# Patient Record
Sex: Male | Born: 1995 | Race: White | Hispanic: No | Marital: Single | Smoking: Never smoker
Health system: Southern US, Community
[De-identification: ages and names within clinical notes are randomized; demographics above are authoritative.]

---
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
## 2020-03-08 ENCOUNTER — Ambulatory Visit
Admission: EM | Admit: 2020-03-08 | Discharge: 2020-03-08 | Disposition: A | Discharge status: Home / Self Care | Attending: Emergency Medicine | Admitting: Emergency Medicine

## 2020-03-08 ENCOUNTER — Encounter: Payer: Self-pay | Admitting: Emergency Medicine

## 2020-03-08 ENCOUNTER — Other Ambulatory Visit: Payer: Self-pay

## 2020-03-08 DIAGNOSIS — S0502XA Injury of conjunctiva and corneal abrasion without foreign body, left eye, initial encounter: Secondary | ICD-10-CM

## 2020-03-08 DIAGNOSIS — H579 Unspecified disorder of eye and adnexa: Secondary | ICD-10-CM

## 2020-03-08 MED ORDER — HYPROMELLOSE 0.3 % OP GEL: OPHTHALMIC | 0 refills | Status: AC | PRN

## 2020-03-08 MED ORDER — POLYMYXIN B-TRIMETHOPRIM 10000-0.1 UNIT/ML-% OP SOLN: 1.0000 [drp] | Freq: Four times a day (QID) | OPHTHALMIC | 0 refills | Status: AC

## 2020-03-08 NOTE — Discharge Instructions (Signed)
Eye flushed in office Use polytrim eye drops as prescribed and to completion Genteal gel eye drops at night as needed for symptomatic relief Use OTC ibuprofen or tylenol as needed for pain relief Return here or go to the ED if symptoms persists or worsen such as fever, chills, redness, swelling, eye pain, painful eye movements, vision changes, etc..Marland Kitchen

## 2020-03-08 NOTE — ED Provider Notes (Signed)
[facility]   [id number] 03/08/20 Arrival Time: 1833  CC: Eye pain; FB sensation  SUBJECTIVE:  Vernon Olson is a 24 y.o. male who presents with complaint of eye discomfort, FB sensation, and tearing x 1 day.  Symptoms began after getting carbon dust in eye.  Was working on a car prior to symptoms.  Has tried flushing eye at work without relief.  Symptoms are made worse with light.  Denies similar symptoms in the past.  Denies fever, chills, nausea, vomiting, painful eye movements, discharge, itching, vision changes, double vision, periorbital erythema.     Denies contact lens use.    ROS: As per HPI.  All other pertinent ROS negative.     History reviewed. No pertinent past medical history. History reviewed. No pertinent surgical history. Allergies  Allergen Reactions  . Other Hives and Itching    All nuts per mother   No current facility-administered medications on file prior to encounter.   Current Outpatient Medications on File Prior to Encounter  Medication Sig Dispense Refill  . cetirizine (ZYRTEC) 10 MG tablet Take 10 mg by mouth daily as needed. For allergies    . PRESCRIPTION MEDICATION Take 1 capsule by mouth daily. Prescription medication taken once daily 30 minutes before meals. Used for help with digestion     Social History   Socioeconomic History  . Marital status: Single    Spouse name: Not on file  . Number of children: Not on file  . Years of education: Not on file  . Highest education level: Not on file  Occupational History  . Not on file  Tobacco Use  . Smoking status: Never Smoker  . Smokeless tobacco: Never Used  Substance and Sexual Activity  . Alcohol use: No  . Drug use: No  . Sexual activity: Not on file  Other Topics Concern  . Not on file  Social History Narrative  . Not on file   Social Determinants of Health   Financial Resource Strain:   . Difficulty of Paying Living Expenses:   Food Insecurity:   . Worried About  Programme researcher, broadcasting/film/video in the Last Year:   . Barista in the Last Year:   Transportation Needs:   . Freight forwarder (Medical):   Marland Kitchen Lack of Transportation (Non-Medical):   Physical Activity:   . Days of Exercise per Week:   . Minutes of Exercise per Session:   Stress:   . Feeling of Stress :   Social Connections:   . Frequency of Communication with Friends and Family:   . Frequency of Social Gatherings with Friends and Family:   . Attends Religious Services:   . Active Member of Clubs or Organizations:   . Attends Banker Meetings:   Marland Kitchen Marital Status:   Intimate Partner Violence:   . Fear of Current or Ex-Partner:   . Emotionally Abused:   Marland Kitchen Physically Abused:   . Sexually Abused:    No family history on file.  OBJECTIVE:    Visual Acuity  Right Eye Distance: 20 Left Eye Distance: 20 Bilateral Distance: 20  Right Eye Near: R Near: 20 Left Eye Near:  L Near: 20 Bilateral Near:  20   Vitals:   03/08/20 1846  BP: 130/83  Pulse: 77  Resp: 18  Temp: 97.6 F (36.4 C)  TempSrc: Oral  SpO2: 98%    General appearance: alert; no distress Eyes: mild to moderate conjunctival erythema. PERRL; EOMI without  discomfort;  no obvious drainage, excessive tearing; no obvious fluorescein uptake  Neck: supple Lungs: clear to auscultation bilaterally Heart: regular rate and rhythm Skin: warm and dry Psychological: alert and cooperative; normal mood and affect   ASSESSMENT & PLAN:  1. Abrasion of left cornea, initial encounter   2. Sensation of foreign body in eye     Meds ordered this encounter  Medications  . trimethoprim-polymyxin b (POLYTRIM) ophthalmic solution    Sig: Place 1 drop into the left eye 4 (four) times daily for 10 days.    Dispense:  10 mL    Refill:  0    Order Specific Question:   Supervising Provider    Answer:   Eustace Moore [[id number]]  . hypromellose (GENTEAL) 0.3 % GEL ophthalmic ointment    Sig: Place into the left  eye every 4 (four) hours as needed for dry eyes.    Dispense:  10 g    Refill:  0    Order Specific Question:   Supervising Provider    Answer:   Eustace Moore [[id number]]   Eye flushed in office Use polytrim eye drops as prescribed and to completion Genteal gel eye drops at night as needed for symptomatic relief Use OTC ibuprofen or tylenol as needed for pain relief Return here or go to the ED if symptoms persists or worsen such as fever, chills, redness, swelling, eye pain, painful eye movements, vision changes, etc...   Reviewed expectations re: course of current medical issues. Questions answered. Outlined signs and symptoms indicating need for more acute intervention. Patient verbalized understanding. After Visit Summary given.   Rennis Harding, PA-C 03/08/20 1945

## 2020-03-08 NOTE — ED Triage Notes (Signed)
States in had got carbon in his eye today.

## 2021-04-18 ENCOUNTER — Emergency Department: Payer: Self-pay

## 2021-04-18 ENCOUNTER — Encounter: Payer: Self-pay | Admitting: *Deleted

## 2021-04-18 ENCOUNTER — Other Ambulatory Visit: Payer: Self-pay

## 2021-04-18 ENCOUNTER — Emergency Department
Admission: EM | Admit: 2021-04-18 | Discharge: 2021-04-18 | Disposition: A | Discharge status: Home / Self Care | Payer: Self-pay | Attending: Emergency Medicine | Admitting: Emergency Medicine

## 2021-04-18 DIAGNOSIS — W1830XA Fall on same level, unspecified, initial encounter: Secondary | ICD-10-CM | POA: Insufficient documentation

## 2021-04-18 DIAGNOSIS — S52101A Unspecified fracture of upper end of right radius, initial encounter for closed fracture: Secondary | ICD-10-CM | POA: Insufficient documentation

## 2021-04-18 DIAGNOSIS — M25521 Pain in right elbow: Secondary | ICD-10-CM | POA: Insufficient documentation

## 2021-04-18 MED ORDER — ACETAMINOPHEN 500 MG PO TABS
1000.0000 mg | ORAL_TABLET | Freq: Once | ORAL | Status: AC
  Administered 2021-04-18: 1000 mg via ORAL
  Filled 2021-04-18: qty 2

## 2021-04-18 NOTE — ED Provider Notes (Signed)
[facility] Emergency Department Provider Note ____________________________________________   Event Date/Time   First MD Initiated Contact with Patient 04/18/21 [phone]     (approximate)  I have reviewed the triage vital signs and the nursing notes.   HISTORY  Chief Complaint Fall    HPI MAY OZMENT is a 25 y.o. male with PMH as noted below who presents with a right elbow injury after a mechanical fall from standing height.  The patient states that he had the elbow directly.  He denies any other injuries.  He states that the elbow hurts bearing any weight.  He denies any weakness or numbness in the hand.  History reviewed. No pertinent past medical history.  There are no problems to display for this patient.   History reviewed. No pertinent surgical history.  Prior to Admission medications   Medication Sig Start Date End Date Taking? Authorizing Provider  cetirizine (ZYRTEC) 10 MG tablet Take 10 mg by mouth daily as needed. For allergies    [provider]  hypromellose (GENTEAL) 0.3 % GEL ophthalmic ointment Place into the left eye every 4 (four) hours as needed for dry eyes. 03/08/20   Wurst, [place], PA-C  PRESCRIPTION MEDICATION Take 1 capsule by mouth daily. Prescription medication taken once daily 30 minutes before meals. Used for help with digestion    [provider]    Allergies Other  No family history on file.  Social History Social History   Tobacco Use   Smoking status: Never   Smokeless tobacco: Never  Substance Use Topics   Alcohol use: No   Drug use: No    Review of Systems  Constitutional: No fever. Eyes: No visual changes. ENT: No sore throat. Cardiovascular: Denies chest pain. Respiratory: Denies shortness of breath. Gastrointestinal: No vomiting. Genitourinary: Negative for flank pain. Musculoskeletal: Negative for back pain.  Positive for right elbow injury. Skin: Negative for  rash. Neurological: Negative for headache.   ____________________________________________   PHYSICAL EXAM:  VITAL SIGNS: ED Triage Vitals  Enc Vitals Group     BP 04/18/21 0107 (!) 133/93     Pulse Rate 04/18/21 0107 83     Resp 04/18/21 0107 16     Temp 04/18/21 0107 98.4 F (36.9 C)     Temp Source 04/18/21 0107 Oral     SpO2 04/18/21 0107 96 %     Weight --      Height --      Head Circumference --      Peak Flow --      Pain Score 04/18/21 0105 4     Pain Loc --      Pain Edu? --      Excl. in GC? --     Constitutional: Alert and oriented. Well appearing and in no acute distress. Eyes: Conjunctivae are normal.  Head: Atraumatic. Nose: No congestion/rhinnorhea. Mouth/Throat: Mucous membranes are moist.   Neck: Normal range of motion.  Cardiovascular: Normal rate, regular rhythm. Good peripheral circulation. Respiratory: Normal respiratory effort.  No retractions.  Gastrointestinal: No distention.  Musculoskeletal: No lower extremity edema.  Extremities warm and well perfused.  2+ radial pulse to right upper extremity.  Right elbow swollen and tender.  Superficial abrasion of the elbow.  Pain on any range of motion.  No tenderness of the proximal humerus. Neurologic:  Normal speech and language.  Motor and sensory intact in median, radial, and ulnar distributions in the right arm. Skin:  Skin is warm and  dry. No rash noted. Psychiatric: Mood and affect are normal. Speech and behavior are normal.  ____________________________________________   LABS (all labs ordered are listed, but only abnormal results are displayed)  Labs Reviewed - No data to display ____________________________________________  EKG   ____________________________________________  RADIOLOGY  XR R elbow: Proximal radius fracture, possible distal humerus fracture  ____________________________________________   PROCEDURES  Procedure(s) performed: No  Procedures  Critical Care  performed: No ____________________________________________   INITIAL IMPRESSION / ASSESSMENT AND PLAN / ED COURSE  Pertinent labs & imaging results that were available during my care of the patient were reviewed by me and considered in my medical decision making (see chart for details).   25 year old male with no active medical problems presents with a right elbow injury after mechanical fall from standing height.  He denies any other injuries.  X-ray obtained from triage shows proximal radius fracture and possible distal humerus fracture.  I consulted Dr. Hyacinth Meeker from orthopedics and I am awaiting further recommendations.  ----------------------------------------- 6:48 AM on 04/18/2021 -----------------------------------------  Dr. Hyacinth Meeker has reviewed the x-rays and recommends placing the patient in a sugar-tong splint and having him follow-up with orthopedics in 2 to 3 days.  He does not recommend any additional imaging emergently.  The splint has been placed.  The patient is comfortable with just Tylenol, and he does not want to be prescribed any opiates.  He is stable for discharge home.  I counseled him on the results of the x-rays and the plan of care.  Return precautions given, and he expresses understanding.  ____________________________________________   FINAL CLINICAL IMPRESSION(S) / ED DIAGNOSES  Final diagnoses:  Closed fracture of proximal end of right radius, unspecified fracture morphology, initial encounter      NEW MEDICATIONS STARTED DURING THIS VISIT:  New Prescriptions   No medications on file     Note:  This document was prepared using Dragon voice recognition software and may include unintentional dictation errors.    Dionne Bucy, MD 04/18/21 [phone]

## 2021-04-18 NOTE — ED Notes (Signed)
Ortho splint applied to right arm - assessed by MD.

## 2021-04-18 NOTE — ED Triage Notes (Signed)
Pt reports he fell down and fell down onto the right side. Abrasion to the right elbow. Unknown tetanus. Right elbow pain.

## 2021-04-18 NOTE — Discharge Instructions (Addendum)
Keep the splint on at all times and keep your arm in the sling until you follow-up.  Call [facility] to follow-up with Dr. Hyacinth Meeker or one of the other orthopedist there in the next 2 to 3 days.  Take Tylenol or ibuprofen as needed for pain.  Return to the ER for new, worsening, or persistent severe pain, numbness, weakness, or any other new or worsening symptoms that concern you.
